# Patient Record
Sex: Male | Born: 2000 | Race: White | Hispanic: No | Marital: Single | Smoking: Never smoker
Health system: Southern US, Community
[De-identification: ages and names within clinical notes are randomized; demographics above are authoritative.]

---

## 2019-02-15 ENCOUNTER — Other Ambulatory Visit: Payer: Self-pay

## 2019-02-15 ENCOUNTER — Emergency Department (HOSPITAL_COMMUNITY): Payer: PRIVATE HEALTH INSURANCE

## 2019-02-15 ENCOUNTER — Emergency Department (HOSPITAL_COMMUNITY)
Admission: EM | Admit: 2019-02-15 | Discharge: 2019-02-15 | Disposition: A | Discharge status: Home / Self Care | Payer: PRIVATE HEALTH INSURANCE | Attending: Emergency Medicine | Admitting: Emergency Medicine

## 2019-02-15 DIAGNOSIS — Y998 Other external cause status: Secondary | ICD-10-CM | POA: Diagnosis not present

## 2019-02-15 DIAGNOSIS — Y9389 Activity, other specified: Secondary | ICD-10-CM | POA: Diagnosis not present

## 2019-02-15 DIAGNOSIS — S060X0A Concussion without loss of consciousness, initial encounter: Secondary | ICD-10-CM | POA: Insufficient documentation

## 2019-02-15 DIAGNOSIS — S161XXA Strain of muscle, fascia and tendon at neck level, initial encounter: Secondary | ICD-10-CM | POA: Insufficient documentation

## 2019-02-15 DIAGNOSIS — Y92410 Unspecified street and highway as the place of occurrence of the external cause: Secondary | ICD-10-CM | POA: Insufficient documentation

## 2019-02-15 DIAGNOSIS — S199XXA Unspecified injury of neck, initial encounter: Secondary | ICD-10-CM | POA: Diagnosis present

## 2019-02-15 MED ORDER — METHOCARBAMOL 500 MG PO TABS: 500.0000 mg | ORAL_TABLET | Freq: Three times a day (TID) | ORAL | 0 refills | Status: AC | PRN

## 2019-02-15 NOTE — Discharge Instructions (Signed)
Follow-up with the concussion clinic or your primary doctor as needed.
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
## 2019-02-20 ENCOUNTER — Ambulatory Visit (INDEPENDENT_AMBULATORY_CARE_PROVIDER_SITE_OTHER): Payer: Self-pay | Admitting: Family Medicine

## 2019-02-20 ENCOUNTER — Encounter: Payer: Self-pay | Admitting: Family Medicine

## 2019-02-20 ENCOUNTER — Other Ambulatory Visit: Payer: Self-pay

## 2019-02-20 VITALS — BP 112/60 | Ht 69.0 in | Wt 140.0 lb

## 2019-02-20 DIAGNOSIS — S060X0A Concussion without loss of consciousness, initial encounter: Secondary | ICD-10-CM
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
## 2019-07-08 ENCOUNTER — Other Ambulatory Visit: Payer: Self-pay | Admitting: *Deleted

## 2019-07-08 DIAGNOSIS — Z20822 Contact with and (suspected) exposure to covid-19: Secondary | ICD-10-CM

## 2019-07-10 LAB — NOVEL CORONAVIRUS, NAA: SARS-CoV-2, NAA: NOT DETECTED
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
